# Patient Record
Sex: Male | Born: 1945 | Race: Black or African American | Hispanic: No | Smoking: Former smoker
Health system: Southern US, Community
[De-identification: ages and names within clinical notes are randomized; demographics above are authoritative.]

## PROBLEM LIST (undated history)

## (undated) DIAGNOSIS — C61 Malignant neoplasm of prostate: Secondary | ICD-10-CM

## (undated) HISTORY — DX: Malignant neoplasm of prostate: C61

## (undated) HISTORY — PX: TONSILLECTOMY: SUR1361

## (undated) HISTORY — PX: PROSTATECTOMY: SHX69

## (undated) HISTORY — PX: WISDOM TOOTH EXTRACTION: SHX21

---

## 2008-08-06 ENCOUNTER — Encounter: Admission: RE | Admit: 2008-08-06 | Discharge: 2008-08-06 | Payer: Self-pay | Admitting: Nephrology

## 2010-04-28 IMAGING — CR DG KNEE 1-2V*L*
2 series · 2 of 2 positions shown · non-contrast
Comparison: none

CLINICAL DATA: Left knee pain since September 2007.  No known
injury.

LEFT KNEE - 1-2 VIEW
None

[view not recorded (1 of 2)]
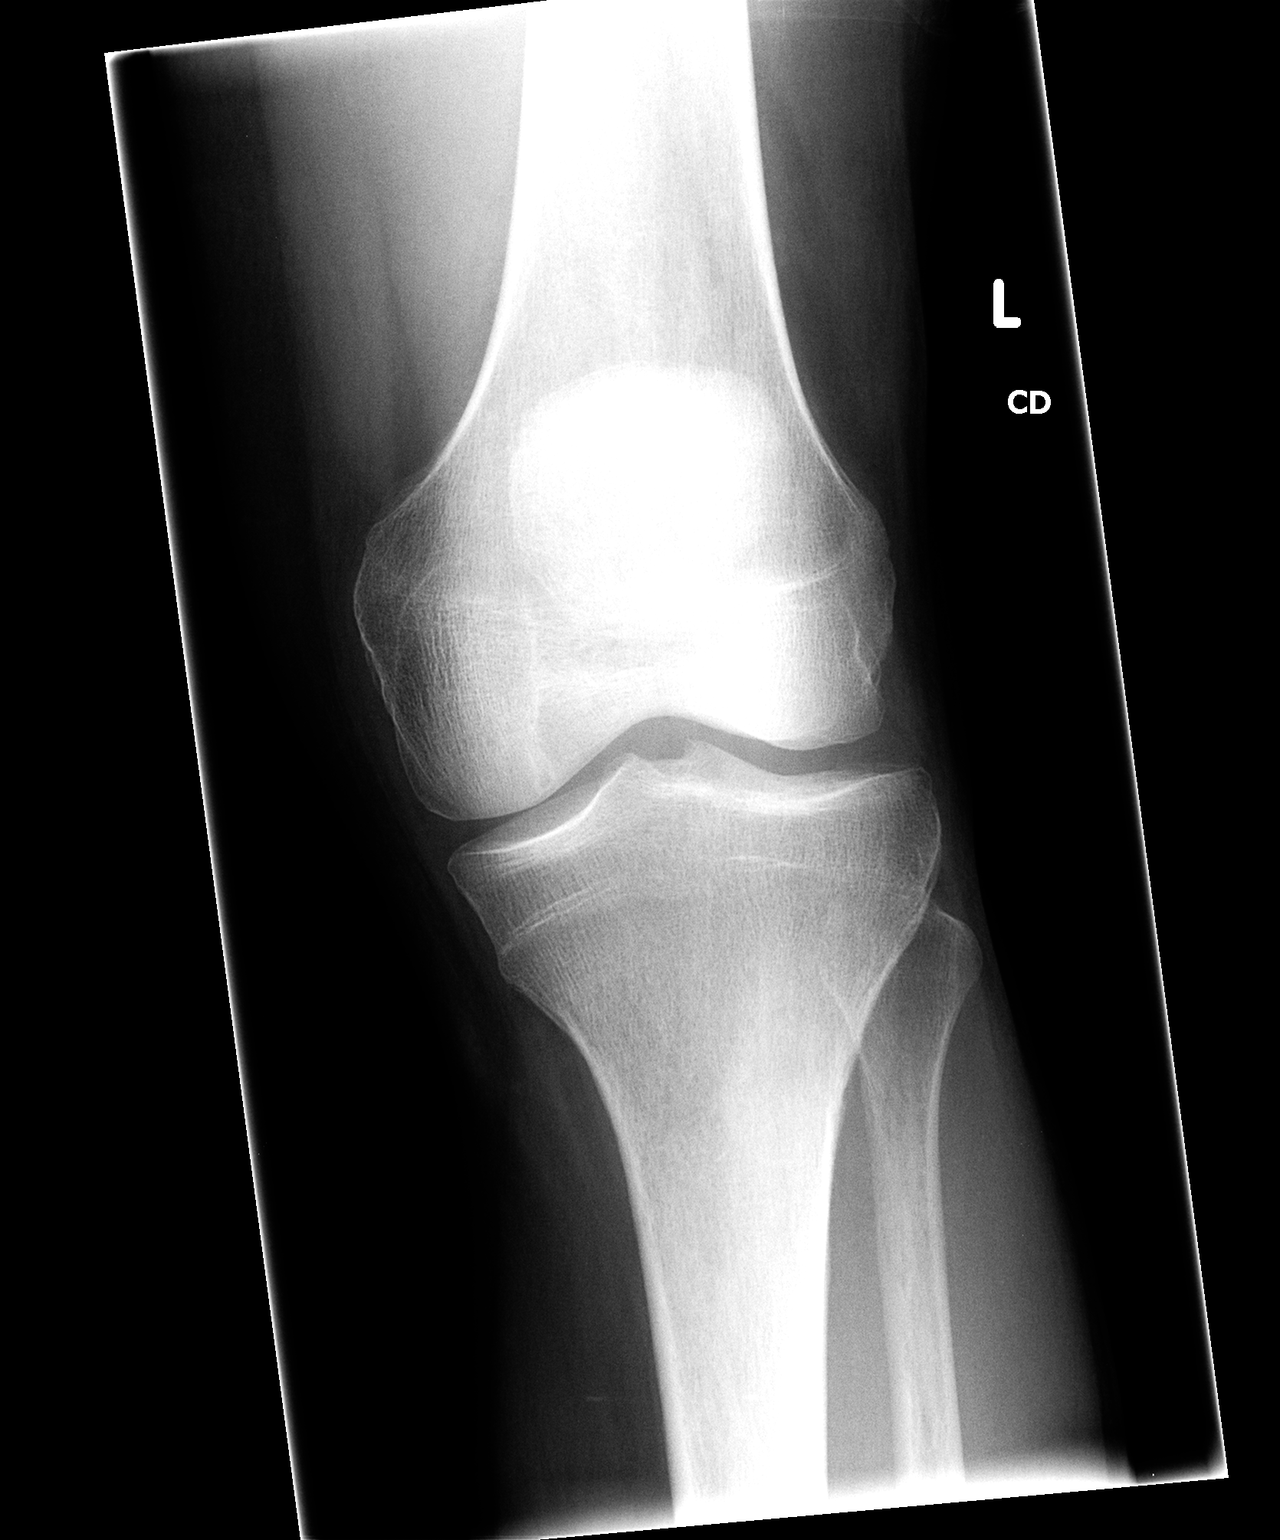

[view not recorded (2 of 2)]
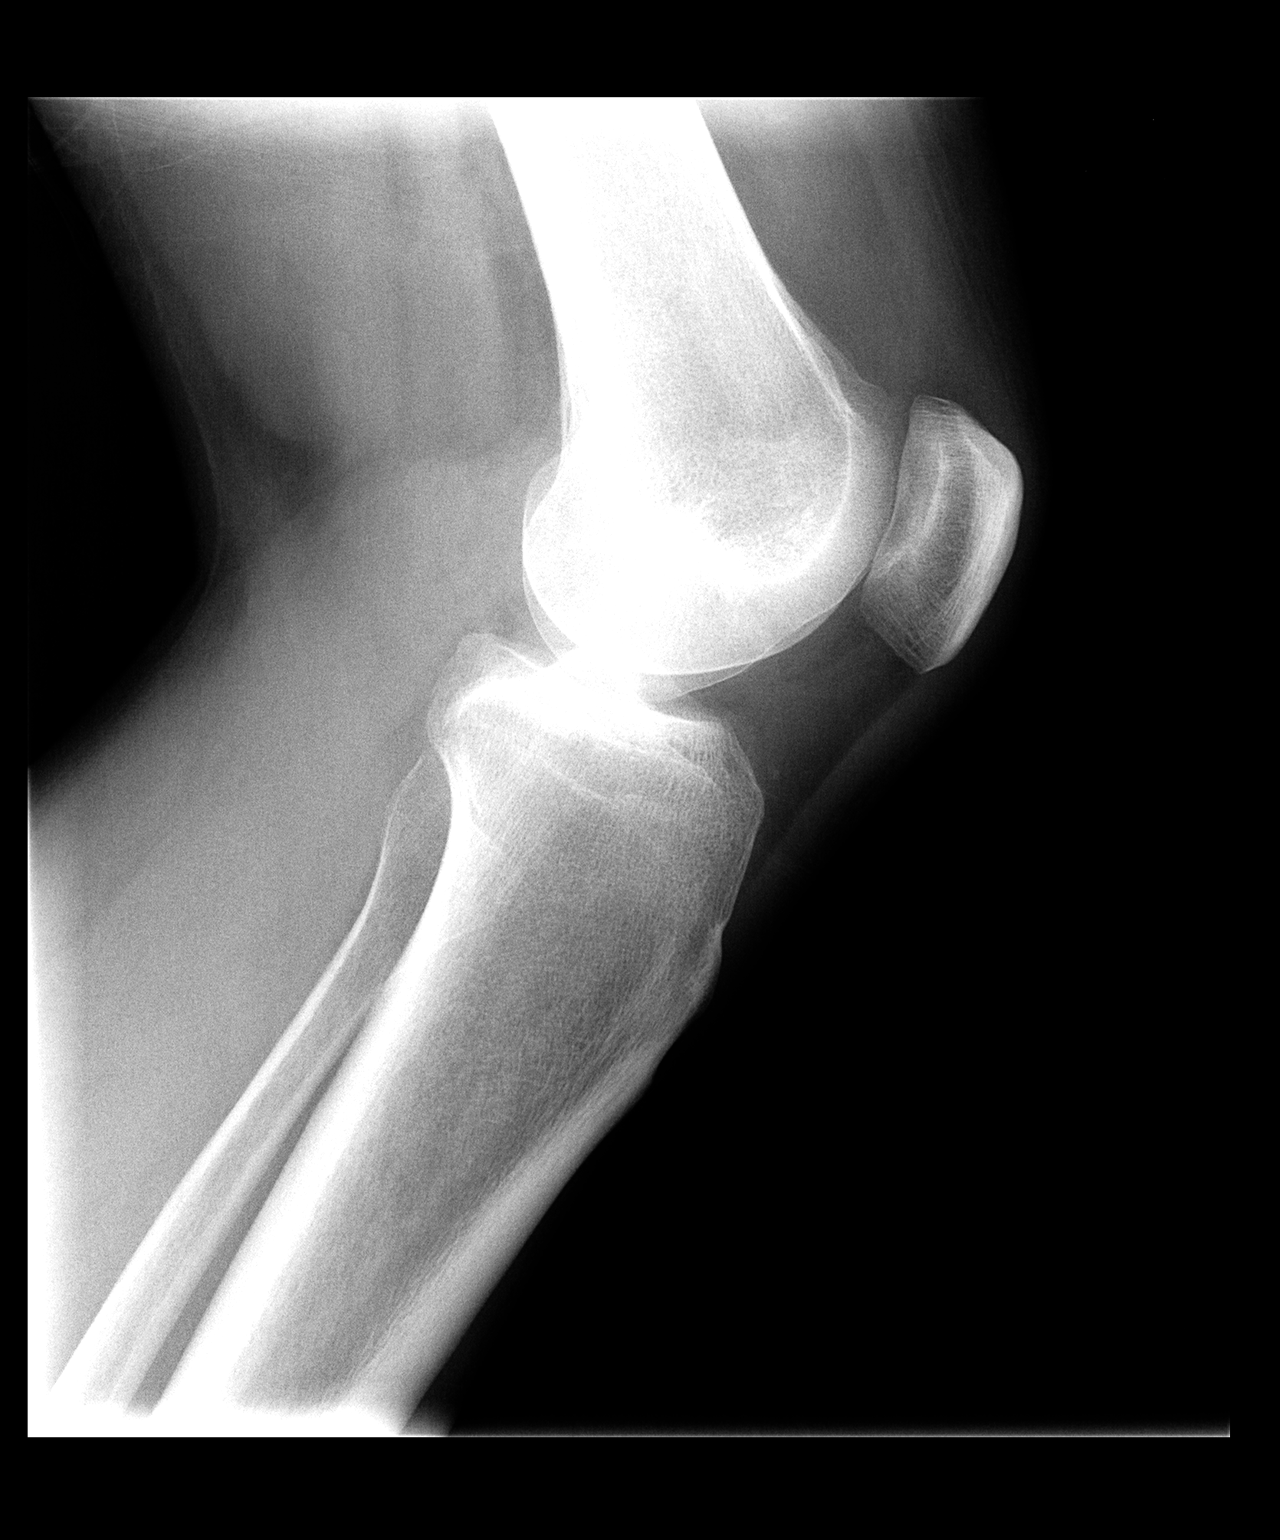

[2 of 2 positions shown; findings below may reference images not displayed]

FINDINGS: No joint space narrowing.  No plain film evidence of
joint effusion.
IMPRESSION: No plain film evidence to suggest significant degenerative changes
or joint effusion.

## 2016-02-20 DIAGNOSIS — C61 Malignant neoplasm of prostate: Secondary | ICD-10-CM | POA: Insufficient documentation

## 2017-04-25 DIAGNOSIS — H409 Unspecified glaucoma: Secondary | ICD-10-CM | POA: Insufficient documentation

## 2017-04-25 DIAGNOSIS — A310 Pulmonary mycobacterial infection: Secondary | ICD-10-CM | POA: Insufficient documentation

## 2021-10-02 ENCOUNTER — Telehealth: Payer: Self-pay | Admitting: Oncology

## 2021-10-02 NOTE — Telephone Encounter (Signed)
Patient requesting Transfer of Care for Hx: Prostate CA.  Appt made for 10/07/2021 Labs 1:45 pm - Consult 2:15 pm

## 2021-10-06 ENCOUNTER — Other Ambulatory Visit: Payer: Self-pay | Admitting: Oncology

## 2021-10-06 DIAGNOSIS — C61 Malignant neoplasm of prostate: Secondary | ICD-10-CM
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
## 2021-10-07 ENCOUNTER — Encounter: Payer: Self-pay | Admitting: Oncology

## 2021-10-07 ENCOUNTER — Inpatient Hospital Stay (INDEPENDENT_AMBULATORY_CARE_PROVIDER_SITE_OTHER): Payer: PRIVATE HEALTH INSURANCE | Admitting: Oncology

## 2021-10-07 ENCOUNTER — Telehealth: Payer: Self-pay | Admitting: Oncology

## 2021-10-07 ENCOUNTER — Inpatient Hospital Stay: Payer: PRIVATE HEALTH INSURANCE | Attending: Oncology

## 2021-10-07 ENCOUNTER — Other Ambulatory Visit: Payer: Self-pay | Admitting: Hematology and Oncology

## 2021-10-07 ENCOUNTER — Other Ambulatory Visit: Payer: Self-pay

## 2021-10-07 VITALS — BP 151/99 | HR 89 | Temp 98.6°F | Resp 16 | Ht 71.5 in | Wt 194.2 lb

## 2021-10-07 DIAGNOSIS — C61 Malignant neoplasm of prostate: Secondary | ICD-10-CM | POA: Insufficient documentation

## 2021-10-07 LAB — BASIC METABOLIC PANEL
BUN: 13 (ref 4–21)
CO2: 28 — AB (ref 13–22)
Chloride: 103 (ref 99–108)
Creatinine: 1.1 (ref 0.6–1.3)
Glucose: 105
Potassium: 4.1 (ref 3.4–5.3)
Sodium: 138 (ref 137–147)

## 2021-10-07 LAB — CBC AND DIFFERENTIAL
HCT: 48 (ref 41–53)
Hemoglobin: 16 (ref 13.5–17.5)
Neutrophils Absolute: 3.78
Platelets: 202 (ref 150–399)
WBC: 6.1

## 2021-10-07 LAB — HEPATIC FUNCTION PANEL
ALT: 86 — AB (ref 10–40)
AST: 49 — AB (ref 14–40)
Alkaline Phosphatase: 55 (ref 25–125)
Bilirubin, Total: 0.6

## 2021-10-07 LAB — PSA: Prostatic Specific Antigen: 0.42 ng/mL (ref 0.00–4.00)

## 2021-10-07 LAB — CBC: RBC: 5.27 — AB (ref 3.87–5.11)

## 2021-10-07 LAB — COMPREHENSIVE METABOLIC PANEL
Albumin: 4.5 (ref 3.5–5.0)
Calcium: 10.1 (ref 8.7–10.7)

## 2021-10-07 NOTE — Telephone Encounter (Signed)
Per 1/4 los next appt scheduled and given to patient °

## 2022-02-08 ENCOUNTER — Ambulatory Visit: Payer: No Typology Code available for payment source | Admitting: Oncology

## 2022-02-10 NOTE — Progress Notes (Signed)
[facility]  [address] [place],  [place]  [address] [phone]  Clinic Day:  02/11/2022  Referring physician:  Self-referral   HISTORY OF PRESENT ILLNESS:  The patient is a 76 y.o. male who I recently began seeing for biochemical recurrence of his prostate cancer.  He comes in today for routine follow-up.  Per our discussions at his initial visit, we came to the mutual decision to implement a watch-and-wait approach for his disease management.  Since his last visit, the patient has been doing well.  He denies having bone pain or other symptoms which concern him for overt sign of disease progression.    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  The patient underwent a prostatectomy in April 2017.  The patient's PSA level became undetectable after surgery.  However it gradually rose to 0.69 over 2+ years.  This led to him receiving salvage radiation in February 2020, in conjunction with 6 months of androgen deprivation therapy.  Although the patient has been doing well, this gentleman's PSA started to rise in February 2021.  In August 2022, his PSA rose to 0.412.  This led to a pylarify PET scan being done, which showed no obvious evidence of metastatic prostate cancer.  The patient has ultimately decided to take a watch-and-wait approach with his disease, particularly as it has been asymptomatic.  PHYSICAL EXAM:  Blood pressure (!) 142/84, pulse 80, temperature 97.6 F (36.4 C), resp. rate 16, height 5' 11.5" (1.816 m), weight 189 lb 1.6 oz (85.8 kg), SpO2 98 %. Wt Readings from Last 3 Encounters:  02/11/22 189 lb 1.6 oz (85.8 kg)  10/07/21 194 lb 3.2 oz (88.1 kg)   Body mass index is 26.01 kg/m. Performance status (ECOG): 0 - Asymptomatic Physical Exam Constitutional:      Appearance: Normal appearance. He is not ill-appearing.  HENT:     Mouth/Throat:      Mouth: Mucous membranes are moist.     Pharynx: Oropharynx is clear. No oropharyngeal exudate or posterior oropharyngeal erythema.  Cardiovascular:     Rate and Rhythm: Normal rate and regular rhythm.     Heart sounds: No murmur heard.   No friction rub. No gallop.  Pulmonary:     Effort: Pulmonary effort is normal. No respiratory distress.     Breath sounds: Normal breath sounds. No wheezing, rhonchi or rales.  Abdominal:     General: Bowel sounds are normal. There is no distension.     Palpations: Abdomen is soft. There is no mass.     Tenderness: There is no abdominal tenderness.  Musculoskeletal:        General: No swelling.     Right lower leg: No edema.     Left lower leg: No edema.  Lymphadenopathy:     Cervical: No cervical adenopathy.     Upper Body:     Right upper body: No supraclavicular or axillary adenopathy.     Left upper body: No supraclavicular or axillary adenopathy.     Lower Body: No right inguinal adenopathy. No left inguinal adenopathy.  Skin:    General: Skin is warm.     Coloration: Skin is not jaundiced.     Findings: No lesion or rash.  Neurological:     General: No focal deficit present.     Mental Status: He is alert and oriented to person, place, and time. Mental  status is at baseline.  Psychiatric:        Mood and Affect: Mood normal.        Behavior: Behavior normal.        Thought Content: Thought content normal.    LABS:   Outside PSA level done on 01-29-22 showed a PSA of  0.589.   Latest Reference Range & Units 10/07/21 13:29  Prostatic Specific Antigen 0.00 - 4.00 ng/mL 0.42   ASSESSMENT & PLAN:  A 76 y.o. male with biochemical relapse of prostate cancer.  Although his PSA remains relatively low, it is detectable and slightly higher than what it was at his last visit.  Nevertheless, this gentleman remains asymptomatic from his disease.  The patient knows he is likely dealing with incurable disease.  For now, his disease will  continue to be followed conservatively.  I will see him back in 4 months for repeat clinical assessment.  If his PSA ever rises above 1, repeat scans would be done to ensure there is no radiographic evidence of metastatic disease.  The patient understands all the plans discussed today and is in agreement with them.  Trinidad Petron Macarthur Critchley, MD

## 2022-02-11 ENCOUNTER — Inpatient Hospital Stay: Payer: BLUE CROSS/BLUE SHIELD | Attending: Oncology | Admitting: Oncology

## 2022-02-11 VITALS — BP 142/84 | HR 80 | Temp 97.6°F | Resp 16 | Ht 71.5 in | Wt 189.1 lb

## 2022-02-11 DIAGNOSIS — C61 Malignant neoplasm of prostate: Secondary | ICD-10-CM | POA: Diagnosis not present

## 2022-02-12 ENCOUNTER — Ambulatory Visit: Payer: No Typology Code available for payment source | Admitting: Oncology

## 2022-02-12 ENCOUNTER — Telehealth: Payer: Self-pay | Admitting: Oncology

## 2022-02-12 NOTE — Telephone Encounter (Signed)
Contacted pt to schedule 6 month follow up per 02/11/22. Pt wishes to call the office back around mid August to schedule his appt for September. ? ?I also updated communication preferences per patients request. ?

## 2022-02-16 ENCOUNTER — Other Ambulatory Visit: Payer: Self-pay

## 2022-06-11 ENCOUNTER — Telehealth: Payer: Self-pay

## 2022-06-11 NOTE — Telephone Encounter (Signed)
Pt called this morning to ask if we have received a fax from his PCP, that has his latest PSA results? I looked all in the pt's chart and couldn't find the fax/information. He is going to call his PCP again and have them re-fax it to [place]. I confirmed the correct fax # [phone]

## 2022-06-11 NOTE — Telephone Encounter (Signed)
@   1600- I gave the faxed copy of PSA to Dr Bobby Rumpf to review. I then placed it in fax basket to be scanned into [facility].  PSA  0.717

## 2022-06-17 NOTE — Progress Notes (Unsigned)
[facility]  [address] [place],  [place]  [address] [phone]  Clinic Day:  06/18/2022  Referring physician:  Self-referral   HISTORY OF PRESENT ILLNESS:  The patient is a 76 y.o. male who I recently began seeing for biochemical recurrence of his prostate cancer.  He comes in today for routine follow-up.  Per our discussions at his initial visit, we came to the mutual decision to implement a watch-and-wait approach for his disease management.  He comes in today to reassess his PSA level.  Since his last visit, the patient has been doing well.  He denies having bone pain or other symptoms which concern him for overt signs of disease progression.    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  The patient underwent a prostatectomy in April 2017.  The patient's PSA level became undetectable after surgery.  However it gradually rose to 0.69 over 2+ years.  This led to him receiving salvage radiation in February 2020, in conjunction with 6 months of androgen deprivation therapy.  Although the patient has been doing well, this gentleman's PSA started to rise in February 2021.  In August 2022, his PSA rose to 0.412.  This led to a pylarify PET scan being done, which showed no obvious evidence of metastatic prostate cancer.  The patient has ultimately decided to take a watch-and-wait approach with his disease, particularly as it has been asymptomatic.  PHYSICAL EXAM:  Blood pressure (!) 158/86, pulse 63, temperature 98.2 F (36.8 C), resp. rate 18, height 5' 11.5" (1.816 m), weight 191 lb 1.6 oz (86.7 kg), SpO2 95 %. Wt Readings from Last 3 Encounters:  06/18/22 191 lb 1.6 oz (86.7 kg)  02/11/22 189 lb 1.6 oz (85.8 kg)  10/07/21 194 lb 3.2 oz (88.1 kg)   Body mass index is 26.28 kg/m. Performance status (ECOG): 0 - Asymptomatic Physical Exam Constitutional:       Appearance: Normal appearance. He is not ill-appearing.  HENT:     Mouth/Throat:     Mouth: Mucous membranes are moist.     Pharynx: Oropharynx is clear. No oropharyngeal exudate or posterior oropharyngeal erythema.  Cardiovascular:     Rate and Rhythm: Normal rate and regular rhythm.     Heart sounds: No murmur heard.    No friction rub. No gallop.  Pulmonary:     Effort: Pulmonary effort is normal. No respiratory distress.     Breath sounds: Normal breath sounds. No wheezing, rhonchi or rales.  Abdominal:     General: Bowel sounds are normal. There is no distension.     Palpations: Abdomen is soft. There is no mass.     Tenderness: There is no abdominal tenderness.  Musculoskeletal:        General: No swelling.     Right lower leg: No edema.     Left lower leg: No edema.  Lymphadenopathy:     Cervical: No cervical adenopathy.     Upper Body:     Right upper body: No supraclavicular or axillary adenopathy.     Left upper body: No supraclavicular or axillary adenopathy.     Lower Body: No right inguinal adenopathy. No left inguinal adenopathy.  Skin:    General: Skin is warm.     Coloration: Skin is not jaundiced.     Findings: No lesion or rash.  Neurological:     General: No focal  deficit present.     Mental Status: He is alert and oriented to person, place, and time. Mental status is at baseline.  Psychiatric:        Mood and Affect: Mood normal.        Behavior: Behavior normal.        Thought Content: Thought content normal.     LABS:  Outside labs done on 05-28-2022 showed a PSA of 0.717  01-29-22:   PSA of  0.589.   Latest Reference Range & Units 10/07/21 13:29  Prostatic Specific Antigen 0.00 - 4.00 ng/mL 0.42   ASSESSMENT & PLAN:  A 76 y.o. male with a biochemical relapse of prostate cancer.  Although his PSA remains relatively low, it is detectable and continues to rise with each successive visit.  Nevertheless, this gentleman remains asymptomatic from his  disease.  The patient knows he is likely dealing with incurable disease.  For now, his disease will continue to be followed conservatively.  I will see him back in 4 months for repeat clinical assessment.  If his PSA ever rises above 1, repeat scans would be done to ensure there is no radiographic evidence of metastatic disease.  The patient understands all the plans discussed today and is in agreement with them.  Areli Jowett Macarthur Critchley, MD

## 2022-06-18 ENCOUNTER — Inpatient Hospital Stay: Payer: Medicare Other | Attending: Oncology | Admitting: Oncology

## 2022-06-18 VITALS — BP 158/86 | HR 63 | Temp 98.2°F | Resp 18 | Ht 71.5 in | Wt 191.1 lb

## 2022-06-18 DIAGNOSIS — C61 Malignant neoplasm of prostate: Secondary | ICD-10-CM | POA: Diagnosis not present

## 2022-10-20 NOTE — Progress Notes (Signed)
[facility]  [address] [place],  [place]  [address] [phone]  Clinic Day:  10/21/2022  Referring physician:  Self-referral   HISTORY OF PRESENT ILLNESS:  The patient is a 77 y.o. 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  He comes in today for routine follow-up.  Per our discussions at his initial visit, we came to the mutual decision to implement a watch-and-wait approach for his disease management.  He comes in today to reassess his PSA level.  Since his last visit, the patient has been doing well.  He denies having bone pain or other symptoms which concern him for overt signs of disease progression.    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  The patient underwent a prostatectomy in April 2017.  The patient's PSA level became undetectable after surgery.  However it gradually rose to 0.69 over 2+ years.  This led to him receiving salvage radiation in February 2020, in conjunction with 6 months of androgen deprivation therapy.  Although the patient has been doing well, this gentleman's PSA started to rise in February 2021.  In August 2022, his PSA rose to 0.412.  This led to a pylarify PET scan being done, which showed no obvious evidence of metastatic prostate cancer.  The patient has ultimately decided to take a watch-and-wait approach with his disease, particularly as he has been asymptomatic.  PHYSICAL EXAM:  Blood pressure (!) 150/92, pulse 80, temperature 98.4 F (36.9 C), resp. rate 18, height 5' 11.5" (1.816 m), weight 182 lb 14.4 oz (83 kg), SpO2 95 %. Wt Readings from Last 3 Encounters:  10/21/22 182 lb 14.4 oz (83 kg)  06/18/22 191 lb 1.6 oz (86.7 kg)  02/11/22 189 lb 1.6 oz (85.8 kg)   Body mass index is 25.15 kg/m. Performance status (ECOG): 0 - Asymptomatic Physical Exam Constitutional:       Appearance: Normal appearance. He is not ill-appearing.  HENT:     Mouth/Throat:     Mouth: Mucous membranes are moist.     Pharynx: Oropharynx is clear. No oropharyngeal exudate or posterior oropharyngeal erythema.  Cardiovascular:     Rate and Rhythm: Normal rate and regular rhythm.     Heart sounds: No murmur heard.    No friction rub. No gallop.  Pulmonary:     Effort: Pulmonary effort is normal. No respiratory distress.     Breath sounds: Normal breath sounds. No wheezing, rhonchi or rales.  Abdominal:     General: Bowel sounds are normal. There is no distension.     Palpations: Abdomen is soft. There is no mass.     Tenderness: There is no abdominal tenderness.  Musculoskeletal:        General: No swelling.     Right lower leg: No edema.     Left lower leg: No edema.  Lymphadenopathy:     Cervical: No cervical adenopathy.     Upper Body:     Right upper body: No supraclavicular or axillary adenopathy.     Left upper body: No supraclavicular or axillary adenopathy.     Lower Body: No right inguinal adenopathy. No left inguinal adenopathy.  Skin:    General: Skin is warm.     Coloration: Skin is not jaundiced.     Findings: No lesion or rash.  Neurological:     General: No focal  deficit present.     Mental Status: He is alert and oriented to person, place, and time. Mental status is at baseline.  Psychiatric:        Mood and Affect: Mood normal.        Behavior: Behavior normal.        Thought Content: Thought content normal.     LABS:  A PSA in Jan 2024 came back at .894  Outside labs done on 05-28-2022 showed a PSA of 0.717  01-29-22:   PSA of  0.589.   Latest Reference Range & Units 10/07/21 13:29  Prostatic Specific Antigen 0.00 - 4.00 ng/mL 0.42   ASSESSMENT & PLAN:  A 77 y.o. male with a biochemical relapse of prostate cancer.  Although his PSA remains relatively low, it is detectable and continues to rise with each successive visit.  Nevertheless, this  gentleman remains asymptomatic from his disease.  The patient knows he is likely dealing with incurable disease.  For now, his disease will continue to be followed conservatively.  I will see him back in 4 months for repeat clinical assessment.  If his PSA ever rises above 1, a repeat PSMA PET scan would be done to ensure radiographic evidence of metastatic disease has not developed over time.  The patient understands all the plans discussed today and is in agreement with them.  Omolola Mittman Macarthur Critchley, MD

## 2022-10-21 ENCOUNTER — Inpatient Hospital Stay: Payer: Medicare Other | Attending: Oncology | Admitting: Oncology

## 2022-10-21 ENCOUNTER — Encounter: Payer: Self-pay | Admitting: Oncology

## 2022-10-21 VITALS — BP 150/92 | HR 80 | Temp 98.4°F | Resp 18 | Ht 71.5 in | Wt 182.9 lb

## 2022-10-21 DIAGNOSIS — C61 Malignant neoplasm of prostate: Secondary | ICD-10-CM | POA: Diagnosis not present

## 2023-02-16 NOTE — Progress Notes (Deleted)
[facility]  [address] [place],  [place]  [address] [phone]  Clinic Day:  10/21/2022  Referring physician:  Self-referral   HISTORY OF PRESENT ILLNESS:  The patient is a 77 y.o. 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  He comes in today for routine follow-up.  Per our discussions at his initial visit, we came to the mutual decision to implement a watch-and-wait approach for his disease management.  He comes in today to reassess his PSA level.  Since his last visit, the patient has been doing well.  He denies having bone pain or other symptoms which concern him for overt signs of disease progression.    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  The patient underwent a prostatectomy in April 2017.  The patient's PSA level became undetectable after surgery.  However it gradually rose to 0.69 over 2+ years.  This led to him receiving salvage radiation in February 2020, in conjunction with 6 months of androgen deprivation therapy.  Although the patient has been doing well, this gentleman's PSA started to rise in February 2021.  In August 2022, his PSA rose to 0.412.  This led to a pylarify PET scan being done, which showed no obvious evidence of metastatic prostate cancer.  The patient has ultimately decided to take a watch-and-wait approach with his disease, particularly as he has been asymptomatic.  PHYSICAL EXAM:  There were no vitals taken for this visit. Wt Readings from Last 3 Encounters:  10/21/22 182 lb 14.4 oz (83 kg)  06/18/22 191 lb 1.6 oz (86.7 kg)  02/11/22 189 lb 1.6 oz (85.8 kg)   There is no height or weight on file to calculate BMI. Performance status (ECOG): 0 - Asymptomatic Physical Exam Constitutional:      Appearance: Normal appearance. He is not ill-appearing.  HENT:     Mouth/Throat:      Mouth: Mucous membranes are moist.     Pharynx: Oropharynx is clear. No oropharyngeal exudate or posterior oropharyngeal erythema.  Cardiovascular:     Rate and Rhythm: Normal rate and regular rhythm.     Heart sounds: No murmur heard.    No friction rub. No gallop.  Pulmonary:     Effort: Pulmonary effort is normal. No respiratory distress.     Breath sounds: Normal breath sounds. No wheezing, rhonchi or rales.  Abdominal:     General: Bowel sounds are normal. There is no distension.     Palpations: Abdomen is soft. There is no mass.     Tenderness: There is no abdominal tenderness.  Musculoskeletal:        General: No swelling.     Right lower leg: No edema.     Left lower leg: No edema.  Lymphadenopathy:     Cervical: No cervical adenopathy.     Upper Body:     Right upper body: No supraclavicular or axillary adenopathy.     Left upper body: No supraclavicular or axillary adenopathy.     Lower Body: No right inguinal adenopathy. No left inguinal adenopathy.  Skin:    General: Skin is warm.     Coloration: Skin is not jaundiced.     Findings: No lesion or rash.  Neurological:     General: No focal deficit present.     Mental Status: He is alert and oriented to person, place,  and time. Mental status is at baseline.  Psychiatric:        Mood and Affect: Mood normal.        Behavior: Behavior normal.        Thought Content: Thought content normal.     LABS:  A PSA in Jan 2024 came back at .894  Outside labs done on 05-28-2022 showed a PSA of 0.717  01-29-22:   PSA of  0.589.   Latest Reference Range & Units 10/07/21 13:29  Prostatic Specific Antigen 0.00 - 4.00 ng/mL 0.42   ASSESSMENT & PLAN:  A 77 y.o. male with a biochemical relapse of prostate cancer.  Although his PSA remains relatively low, it is detectable and continues to rise with each successive visit.  Nevertheless, this gentleman remains asymptomatic from his disease.  The patient knows he is likely dealing  with incurable disease.  For now, his disease will continue to be followed conservatively.  I will see him back in 4 months for repeat clinical assessment.  If his PSA ever rises above 1, a repeat PSMA PET scan would be done to ensure radiographic evidence of metastatic disease has not developed over time.  The patient understands all the plans discussed today and is in agreement with them.  Damyon Mullane Kirby Funk, MD

## 2023-02-17 ENCOUNTER — Inpatient Hospital Stay: Payer: PRIVATE HEALTH INSURANCE | Admitting: Oncology

## 2023-02-17 ENCOUNTER — Telehealth: Payer: Self-pay | Admitting: Oncology

## 2023-02-17 NOTE — Telephone Encounter (Signed)
02/17/23 Patient will call back to schedule appt
